# Patient Record
Sex: Male | Born: 1980 | Race: Black or African American | Hispanic: No | Marital: Married | Smoking: Never smoker
Health system: Southern US, Community
[De-identification: ages and names within clinical notes are randomized; demographics above are authoritative.]

## PROBLEM LIST (undated history)

## (undated) DIAGNOSIS — I10 Essential (primary) hypertension: Secondary | ICD-10-CM

## (undated) DIAGNOSIS — G4700 Insomnia, unspecified: Secondary | ICD-10-CM

## (undated) DIAGNOSIS — G43909 Migraine, unspecified, not intractable, without status migrainosus: Secondary | ICD-10-CM

## (undated) DIAGNOSIS — B009 Herpesviral infection, unspecified: Secondary | ICD-10-CM

## (undated) DIAGNOSIS — R202 Paresthesia of skin: Secondary | ICD-10-CM

## (undated) HISTORY — DX: Essential (primary) hypertension: I10

## (undated) HISTORY — DX: Paresthesia of skin: R20.2

## (undated) HISTORY — DX: Herpesviral infection, unspecified: B00.9

## (undated) HISTORY — DX: Migraine, unspecified, not intractable, without status migrainosus: G43.909

## (undated) HISTORY — DX: Insomnia, unspecified: G47.00

---

## 2006-03-13 ENCOUNTER — Emergency Department (HOSPITAL_COMMUNITY): Admission: EM | Admit: 2006-03-13 | Discharge: 2006-03-14 | Payer: Self-pay | Admitting: Emergency Medicine

## 2006-12-06 ENCOUNTER — Emergency Department (HOSPITAL_COMMUNITY): Admission: EM | Admit: 2006-12-06 | Discharge: 2006-12-06 | Payer: Self-pay | Admitting: Emergency Medicine

## 2007-09-05 ENCOUNTER — Emergency Department (HOSPITAL_COMMUNITY): Admission: EM | Admit: 2007-09-05 | Discharge: 2007-09-05 | Payer: Self-pay | Admitting: Emergency Medicine

## 2008-09-27 IMAGING — CT CT ANGIO CHEST
1 of 2 series · 19 of 30 positions shown · IV contrast (APPLIED)
Comparison: none

CLINICAL DATA: Chest wall pain.  Elevated D-dimer.  Possible PE.
CT ANGIOGRAPHY OF CHEST:
TECHNIQUE: Multidetector CT imaging of the chest was performed during bolus injection of intravenous contrast.  Multiplanar CT angiographic image reconstructions were generated to evaluate the vascular anatomy.
Contrast:  80 cc Omnipaque.

[Series 6: pe 1.0 b20f st · axial · 0.66mm/px · z∈[+1111,+1345]mm · 19 of 261 slices shown]
[im 14/261  lung]
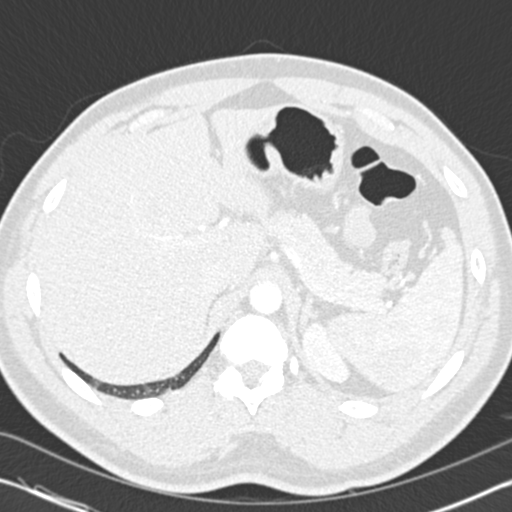
[im 27/261  mediastinal]
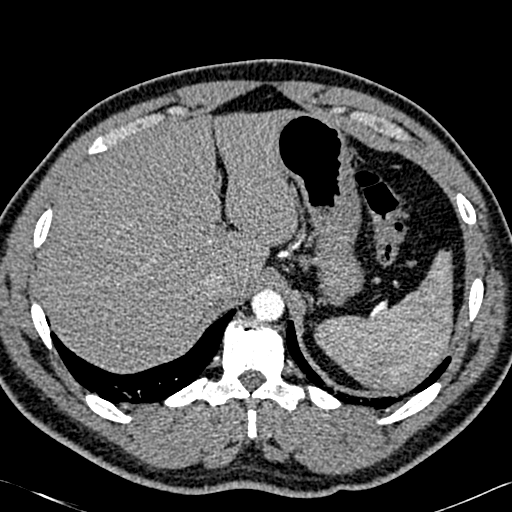
[im 40/261  lung]
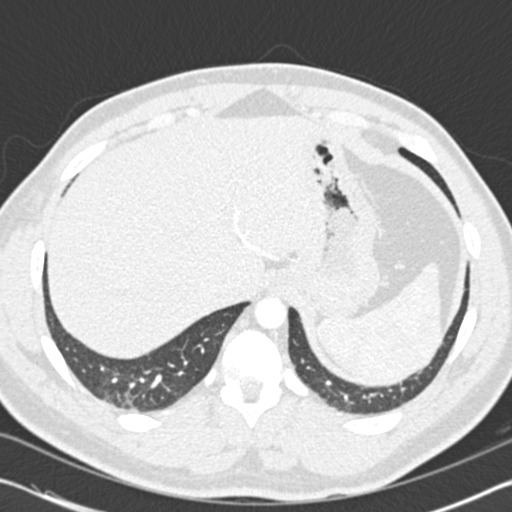
[im 53/261  mediastinal]
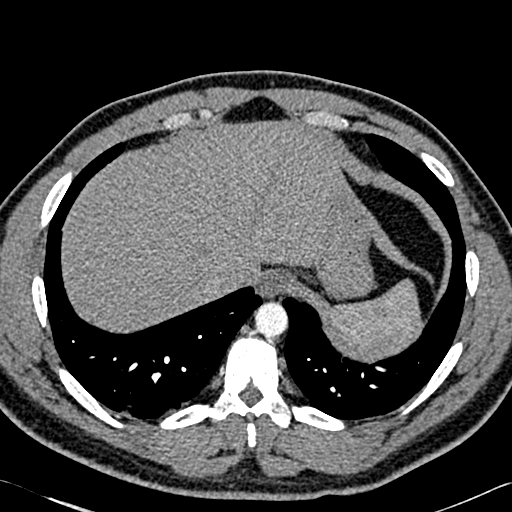
[im 66/261  lung]
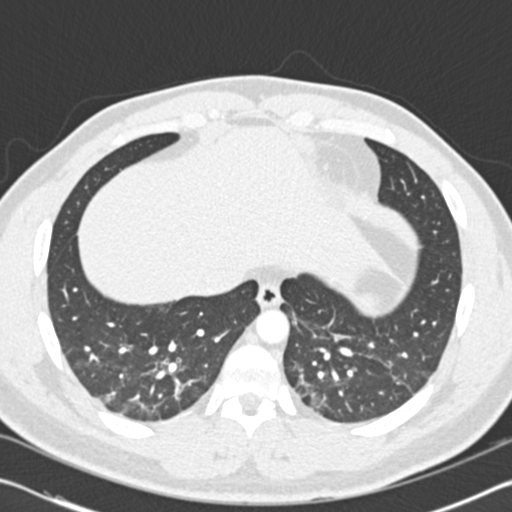
[im 79/261  mediastinal]
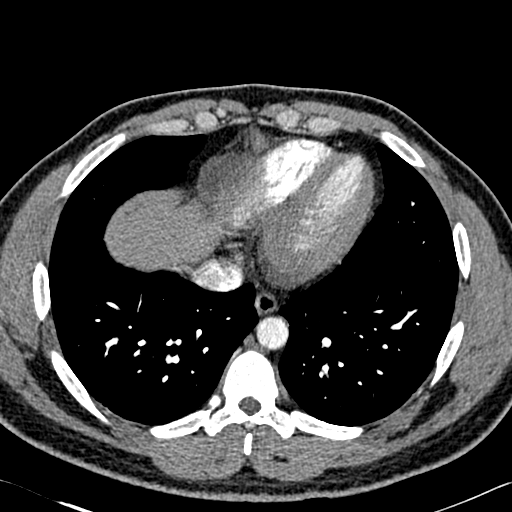
[im 92/261  lung]
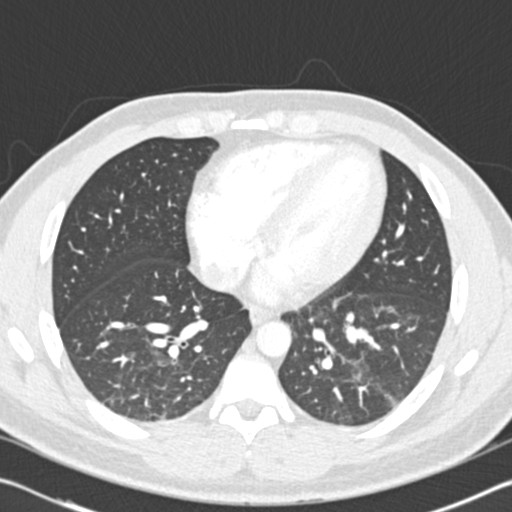
[im 105/261  mediastinal]
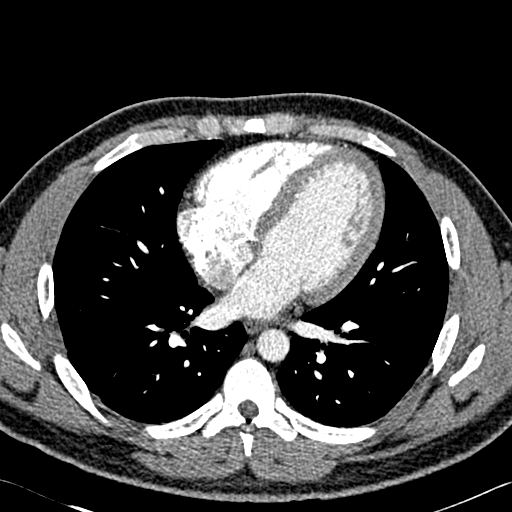
[im 118/261  lung]
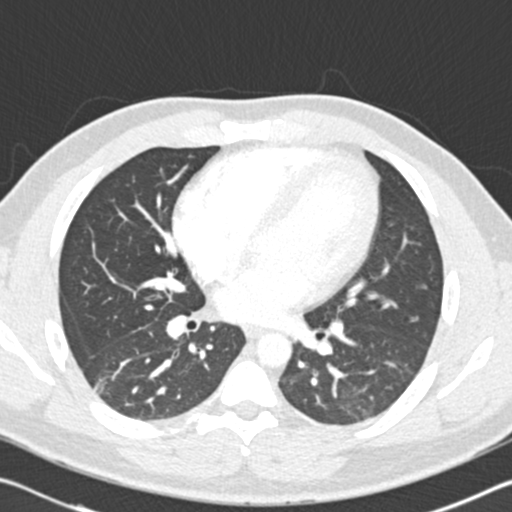
[im 131/261  mediastinal]
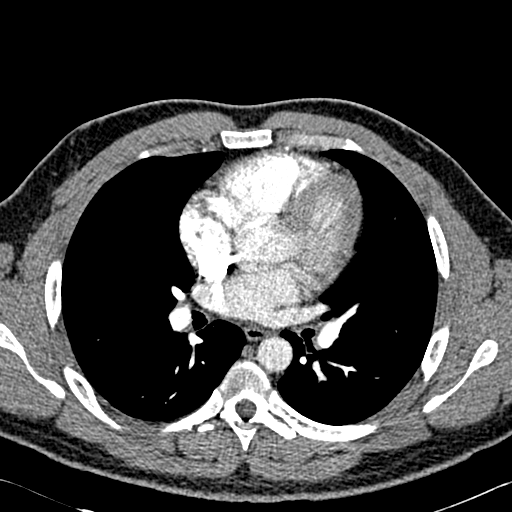
[im 144/261  lung]
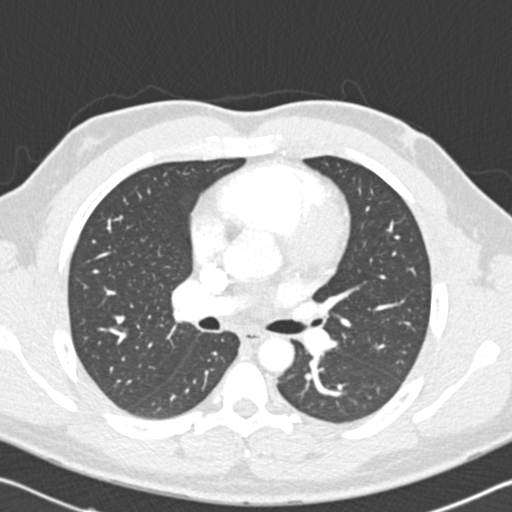
[im 157/261  mediastinal]
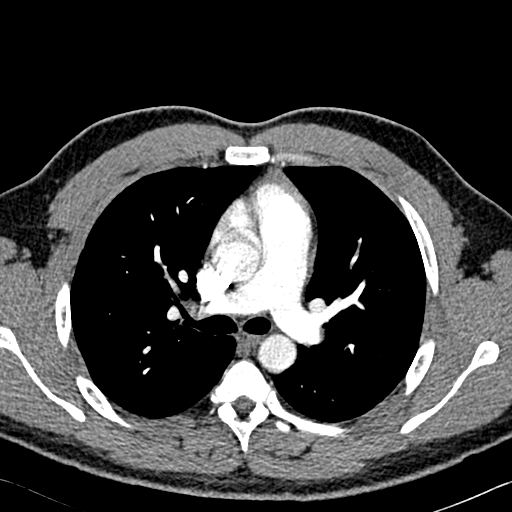
[im 170/261  lung]
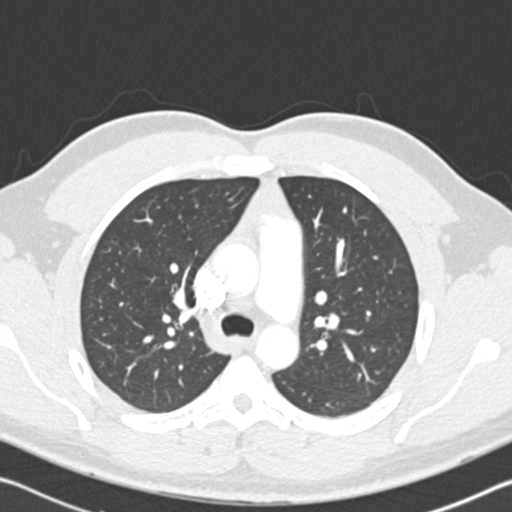
[im 183/261  mediastinal]
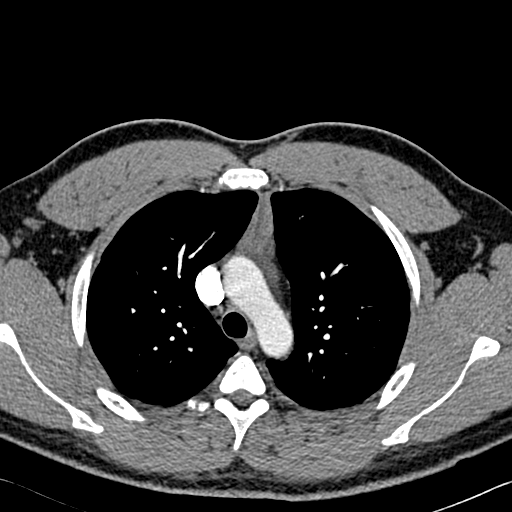
[im 196/261  lung]
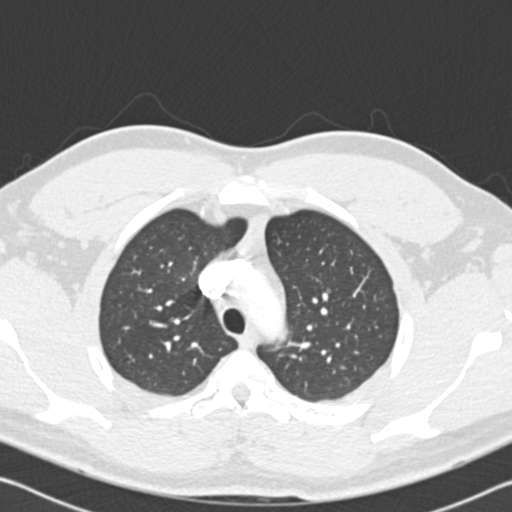
[im 209/261  mediastinal]
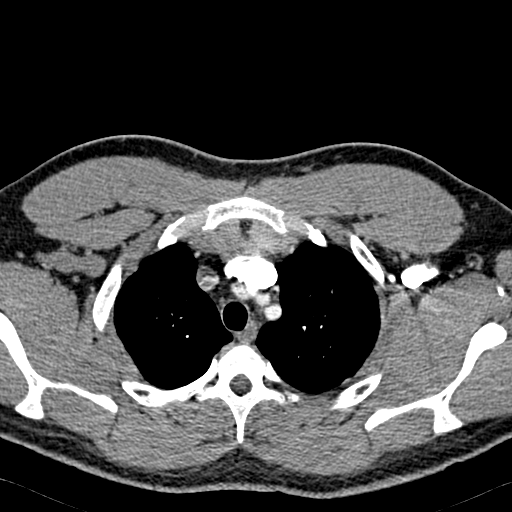
[im 222/261  lung]
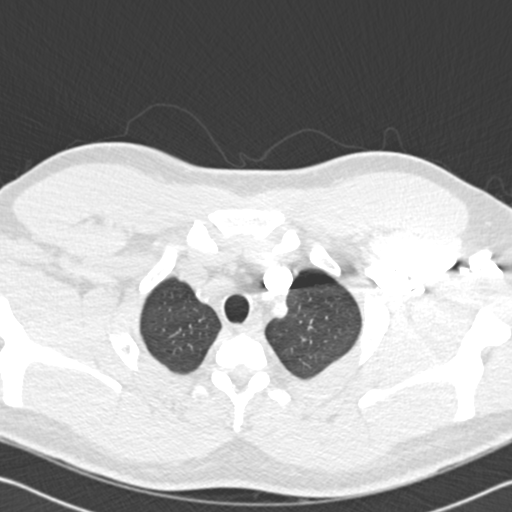
[im 235/261  mediastinal]
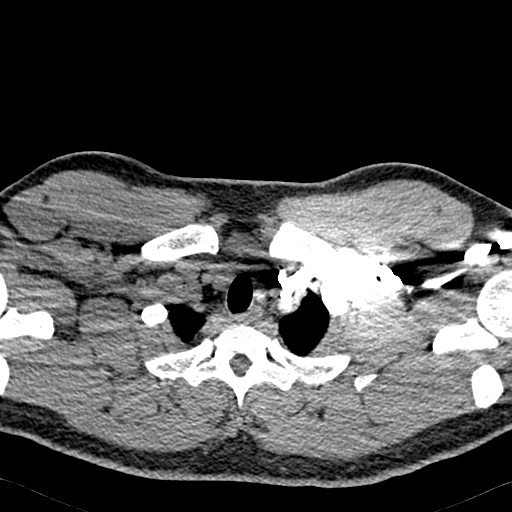
[im 248/261  lung]
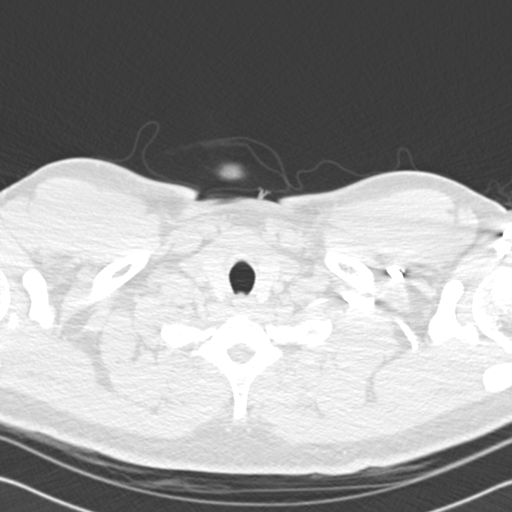

[19 of 30 positions shown; findings below may reference images not displayed]

FINDINGS: Images of the thoracic inlet are unremarkable.  No pulmonary embolus is identified.  There is no adenopathy.  Heart size is within normal limits.  The pulmonic arteries are unremarkable.  Thoracic aorta is unremarkable without evidence of a aneurysm.  There is no mediastinal hematoma.  There is no axillary adenopathy.  
No destructive bony lesions are seen. 
Images of the lung parenchyma shows no active infiltrate or pleural effusion.  No destructive bony lesions are seen.  No acute fractures are noted.
IMPRESSION: 1.  No pulmonary embolus is noted.  
2.  No infiltrate or pleural effusion.  
3.  No adenopathy.

## 2008-09-27 IMAGING — CR DG CHEST 2V
2 series · 2 of 2 positions shown · non-contrast
Comparison: 03/13/2006

CLINICAL DATA: Chest wall pain.   Left side pain.  
 CHEST - 2 VIEW:

[w chest pa]
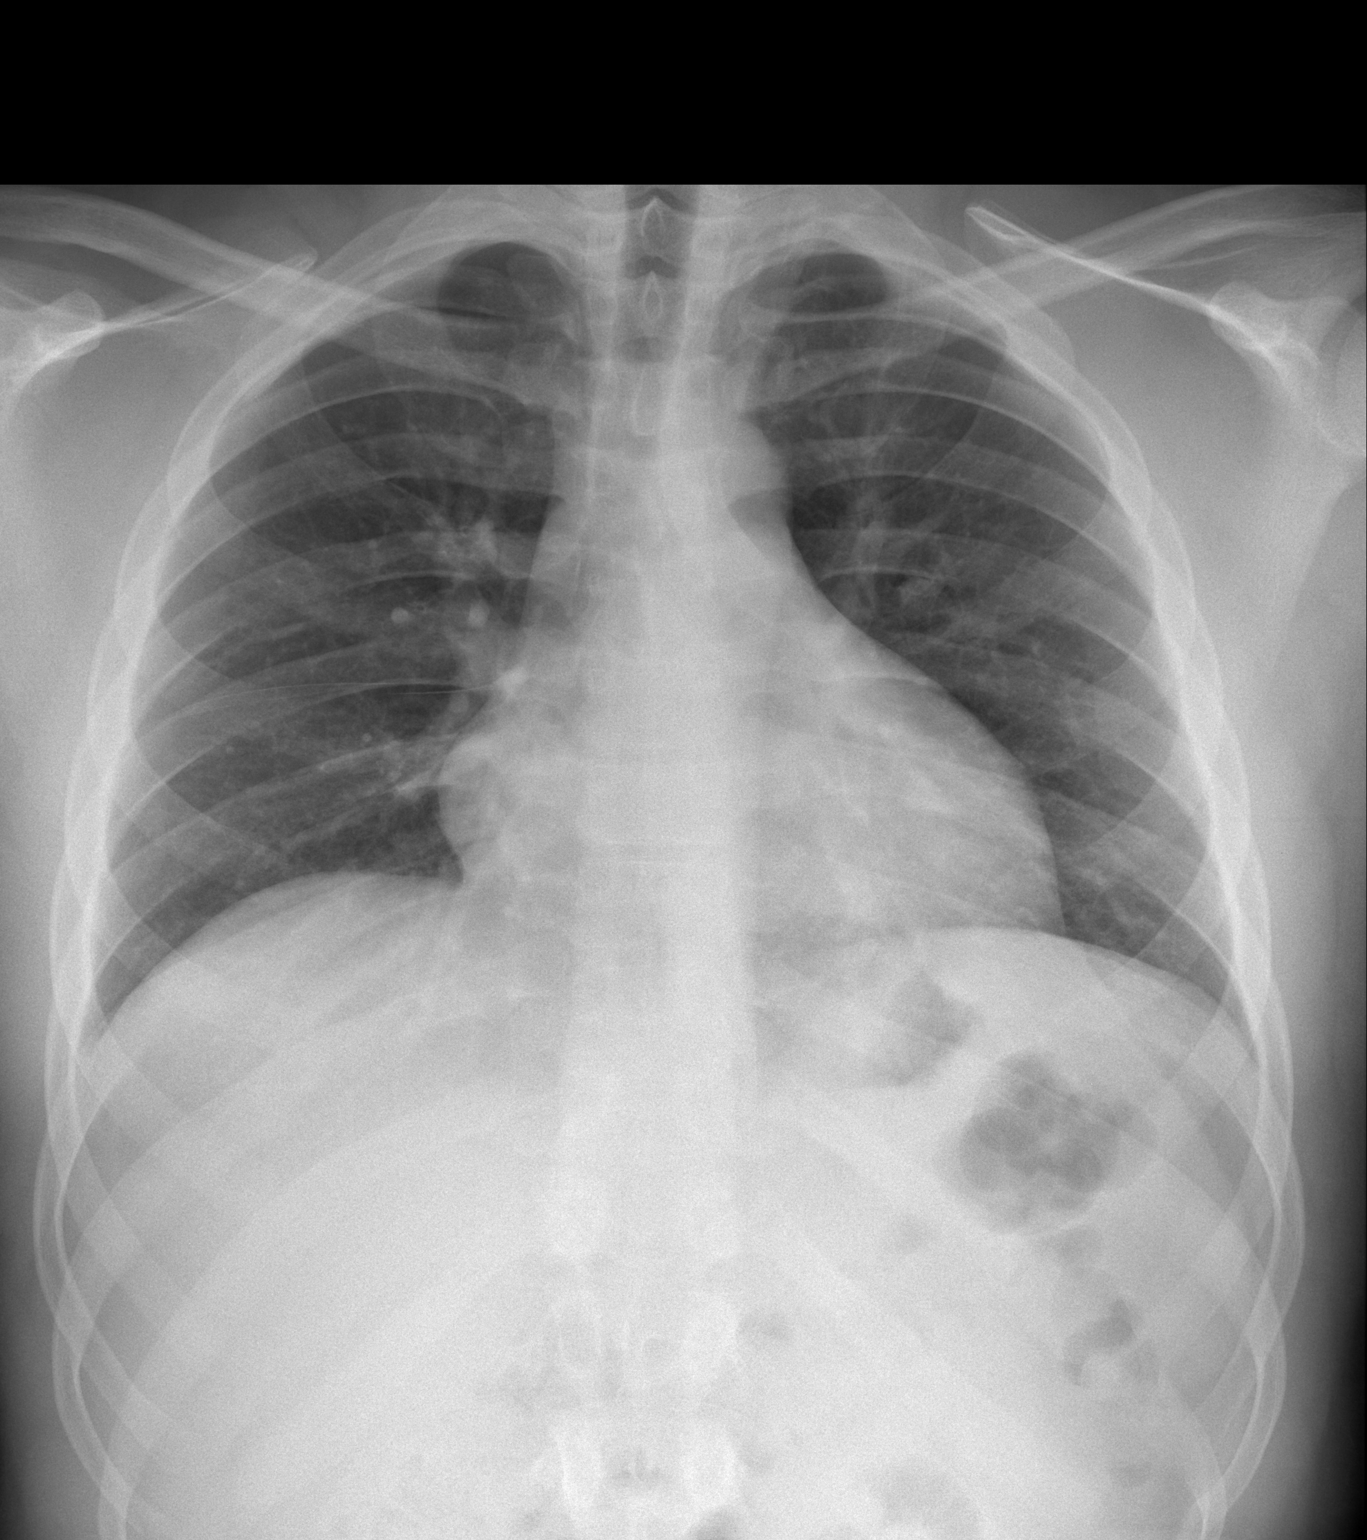

[w chest lat]
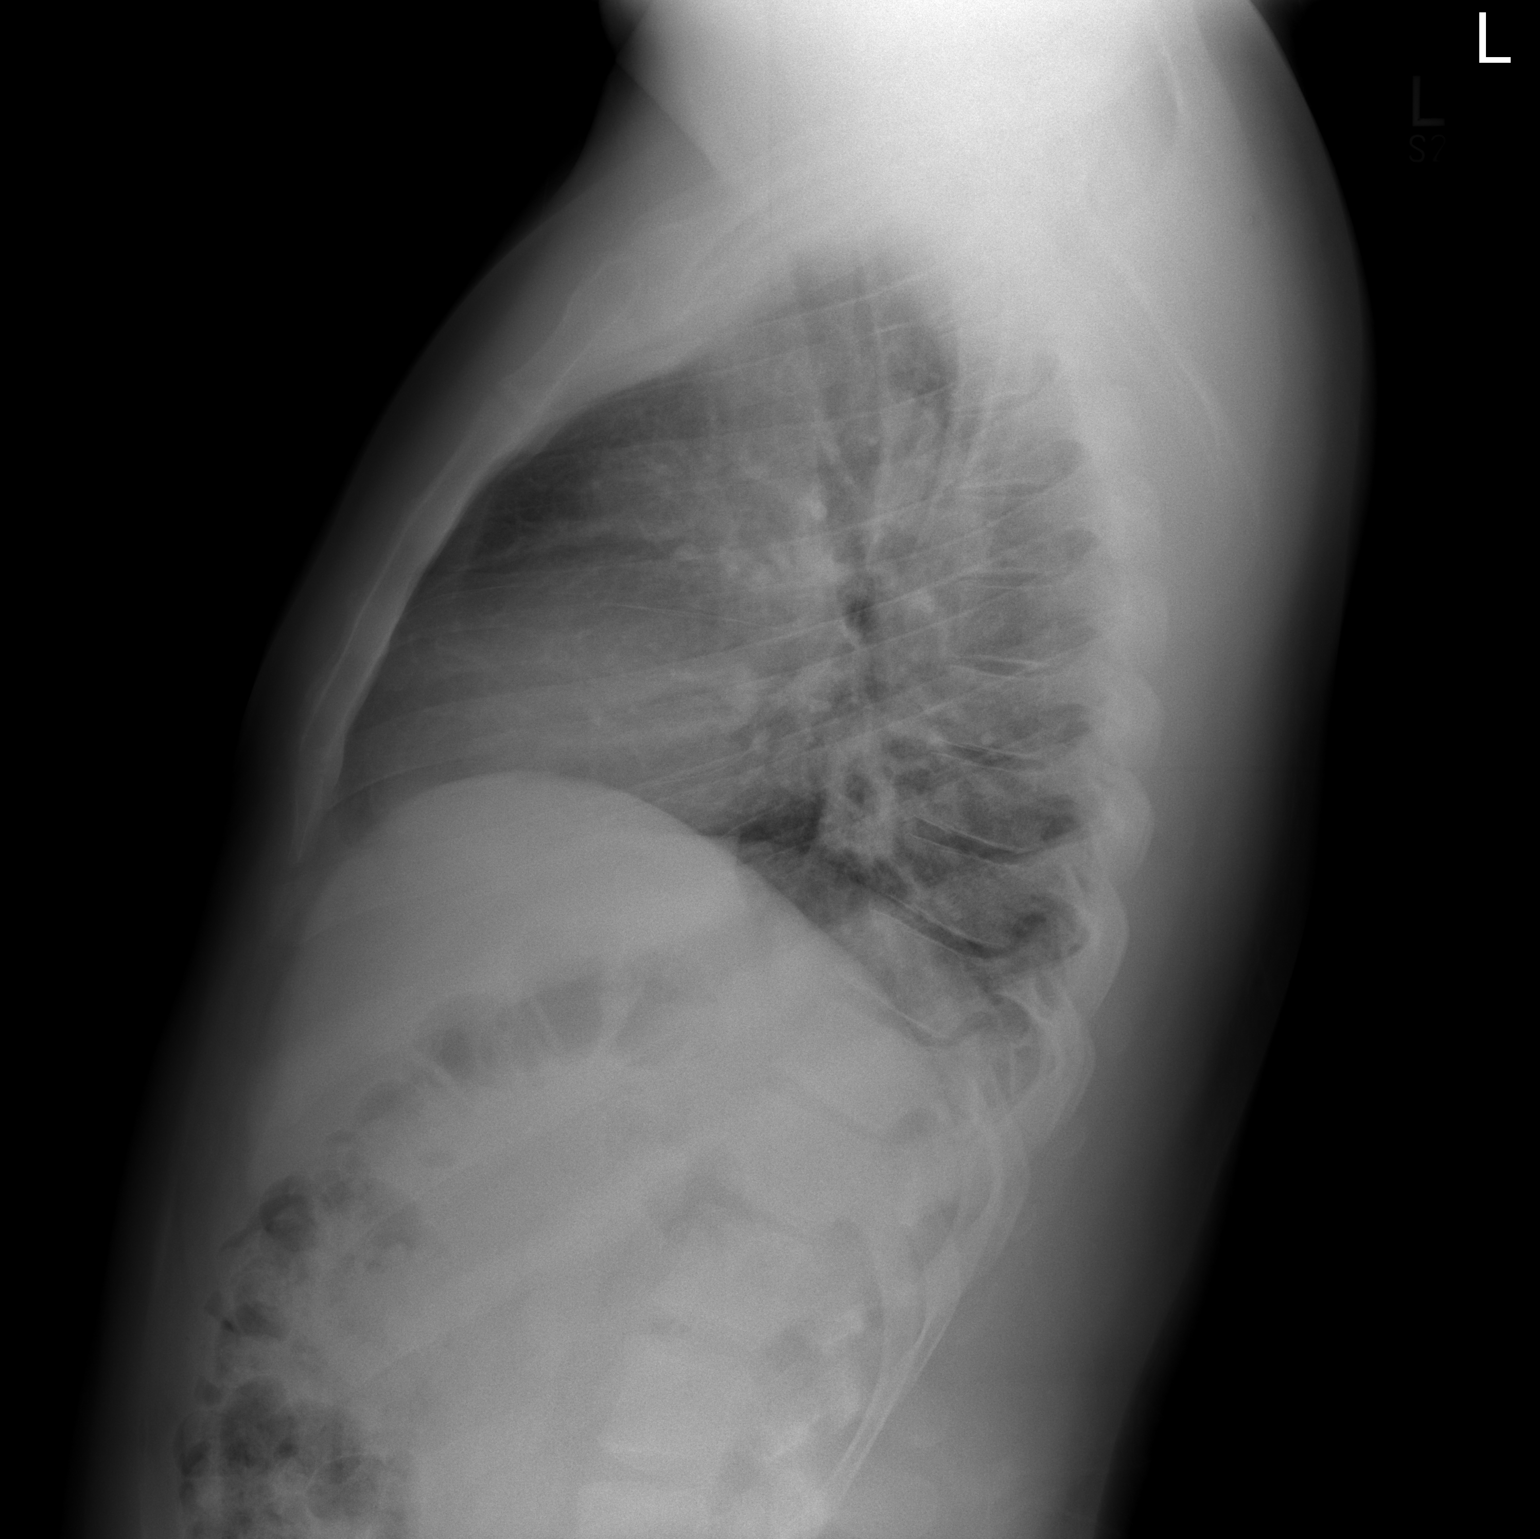

[2 of 2 positions shown; findings below may reference images not displayed]

FINDINGS: The cardiomediastinal silhouette is stable.  There is no acute infiltrate or pleural effusion. The bony structures are unremarkable.
IMPRESSION: No acute disease.

## 2011-08-10 LAB — BASIC METABOLIC PANEL
CO2: 28
Chloride: 104
GFR calc Af Amer: >60
GFR calc non Af Amer: >60

## 2011-08-10 LAB — CBC
HCT: 47
Hemoglobin: 15.6
MCHC: 33.2
MCV: 77 — ABNORMAL LOW
RDW: 13.7

## 2011-08-10 LAB — DIFFERENTIAL
Eosinophils Absolute: 0.3
Eosinophils Relative: 4
Lymphocytes Relative: 28
Lymphs Abs: 2.1
Monocytes Relative: 4

## 2011-08-10 LAB — D-DIMER, QUANTITATIVE: D-Dimer, Quant: 0.94 — ABNORMAL HIGH

## 2011-08-10 LAB — POCT CARDIAC MARKERS
CKMB, poc: <1 — ABNORMAL LOW
Operator id: 4295

## 2014-12-20 ENCOUNTER — Other Ambulatory Visit (HOSPITAL_COMMUNITY): Payer: Self-pay | Admitting: Urology

## 2014-12-20 DIAGNOSIS — N3644 Muscular disorders of urethra: Secondary | ICD-10-CM

## 2015-01-07 ENCOUNTER — Ambulatory Visit (HOSPITAL_COMMUNITY)
Admission: RE | Admit: 2015-01-07 | Discharge: 2015-01-07 | Disposition: A | Discharge status: Home / Self Care | Payer: 59 | Source: Ambulatory Visit | Attending: Urology | Admitting: Urology

## 2015-01-07 DIAGNOSIS — R350 Frequency of micturition: Secondary | ICD-10-CM | POA: Insufficient documentation

## 2015-01-07 DIAGNOSIS — N3644 Muscular disorders of urethra: Secondary | ICD-10-CM | POA: Diagnosis present

## 2015-01-07 MED ORDER — GADOBENATE DIMEGLUMINE 529 MG/ML IV SOLN
20.0000 mL | Freq: Once | INTRAVENOUS | Status: AC | PRN
  Administered 2015-01-07: 20 mL via INTRAVENOUS

## 2015-05-03 ENCOUNTER — Emergency Department (INDEPENDENT_AMBULATORY_CARE_PROVIDER_SITE_OTHER)
Admission: EM | Admit: 2015-05-03 | Discharge: 2015-05-03 | Disposition: A | Discharge status: Home / Self Care | Payer: 59 | Source: Home / Self Care | Attending: Emergency Medicine | Admitting: Emergency Medicine

## 2015-05-03 ENCOUNTER — Encounter (HOSPITAL_COMMUNITY): Payer: Self-pay | Admitting: Emergency Medicine

## 2015-05-03 DIAGNOSIS — G4452 New daily persistent headache (NDPH): Secondary | ICD-10-CM | POA: Diagnosis not present

## 2015-05-03 MED ORDER — KETOROLAC TROMETHAMINE 60 MG/2ML IM SOLN
INTRAMUSCULAR | Status: AC
  Filled 2015-05-03: qty 2

## 2015-05-03 MED ORDER — KETOROLAC TROMETHAMINE 10 MG PO TABS: 10.0000 mg | ORAL_TABLET | Freq: Four times a day (QID) | ORAL | Status: DC | PRN

## 2015-05-03 MED ORDER — KETOROLAC TROMETHAMINE 60 MG/2ML IM SOLN
60.0000 mg | Freq: Once | INTRAMUSCULAR | Status: AC
  Administered 2015-05-03: 60 mg via INTRAMUSCULAR

## 2015-05-03 NOTE — ED Provider Notes (Addendum)
CSN: [id number]     Arrival date & time 05/03/15  1323 History   First MD Initiated Contact with Patient 05/03/15 1459     Chief Complaint  Patient presents with  . Headache   (Consider location/radiation/quality/duration/timing/severity/associated sxs/prior Treatment) HPI Comments: 34 year old male is complaining of a headache for approximately one week. It is located behind the left eye. It does not migrate or radiate. Nothing seems to make it worse. Taking over-the-counter medicines such as Excedrin migraine helps modestly in temporarily. It waxes and wanes but is always there. He is able to sleep at night. It tends to be worse in the morning upon awakening. Having said that the patient states that he sleeps under cold air at 65 at night and this seems that this may be making it worse. He also had a recent change and prescription for his classes. And he is adjusting to the new prescription. He works at a computer all day long. He denies photophobia, nausea, vomiting, problems with vision, speech, hearing, swallowing, focal paresthesias or weakness or balance. Denies problems with confusion, disorientation, cognitive performance or memory. His speech is  lucid and goal oriented.   History reviewed. No pertinent past medical history. History reviewed. No pertinent past surgical history. History reviewed. No pertinent family history. History  Substance Use Topics  . Smoking status: Not on file  . Smokeless tobacco: Not on file  . Alcohol Use: Not on file    Review of Systems  Constitutional: Negative for fever, chills, appetite change and fatigue.  HENT: Negative for postnasal drip, sore throat, tinnitus and trouble swallowing.   Eyes: Negative for discharge.       Mild blurriness to the left eye. He states the headache pain is behind the left eye but not involving the left eye.  Respiratory: Negative.   Cardiovascular: Negative.   Gastrointestinal: Negative.   Genitourinary: Negative.    Musculoskeletal: Negative for myalgias, back pain, joint swelling, gait problem, neck pain and neck stiffness.  Skin: Negative.   Neurological: Positive for headaches. Negative for dizziness, tremors, seizures, syncope, facial asymmetry, speech difficulty, weakness, light-headedness and numbness.  Psychiatric/Behavioral: Negative.     Allergies  Review of patient's allergies indicates no known allergies.  Home Medications   Prior to Admission medications   Medication Sig Start Date End Date Taking? Authorizing Provider  OVER THE COUNTER MEDICATION Sinus/migraine headache medicine Excedrine   Yes Historical Provider, MD  ketorolac (TORADOL) 10 MG tablet Take 1 tablet (10 mg total) by mouth 4 (four) times daily as needed. 05/03/15   Hayden Rasmussen, NP   BP 138/100 mmHg  Pulse 70  Temp(Src) 98 F (36.7 C) (Oral)  Resp 18  SpO2 98% Physical Exam  Constitutional: He is oriented to person, place, and time. He appears well-developed and well-nourished. No distress.  HENT:  Head: Normocephalic and atraumatic.  Bilateral TMs are normal Oropharynx  clear and moist. No swelling. No erythema. soft palate rises symmetrically. Uvula and tongue are midline.   Eyes: Conjunctivae and EOM are normal. Pupils are equal, round, and reactive to light. Right eye exhibits no discharge. Left eye exhibits no discharge.  Neck: Normal range of motion. Neck supple.  Cardiovascular: Normal rate, regular rhythm and normal heart sounds.   Pulmonary/Chest: Effort normal and breath sounds normal.  Musculoskeletal: Normal range of motion. He exhibits no edema.  Lymphadenopathy:    He has no cervical adenopathy.  Neurological: He is alert and oriented to person, place, and time. He has normal  strength and normal reflexes. No cranial nerve deficit or sensory deficit. He exhibits normal muscle tone. He displays a negative Romberg sign. Coordination and gait normal. GCS eye subscore is 4. GCS verbal subscore is 5. GCS  motor subscore is 6.  Reflex Scores:      Patellar reflexes are 2+ on the right side and 2+ on the left side. Heel to toe normal. No dysmetria. No dysdiadochokinesia.  Skin: Skin is warm and dry. No rash noted. No erythema.  Psychiatric: He has a normal mood and affect.  Nursing note and vitals reviewed.   ED Course  Procedures (including critical care time) Labs Review Labs Reviewed - No data to display  Imaging Review No results found.   MDM   1. New daily persistent headache    Neurologic exam is unremarkable. No abnormalities found. I suspect his headache is multifactorial and may be related to a combination of factors such as stress, working with a computer, new prescription for refraction error, sleeping under cold air at nighttime, mild occasional nasal congestion. No evidence of intracranial lesion affect. Toradol 60 mg IM    Hayden Rasmussenavid Idalys Konecny, NP 05/03/15 1613  Hayden Rasmussenavid Annison Birchard, NP 05/06/15 2016

## 2015-05-03 NOTE — ED Notes (Signed)
Discharge was delayed secondary to post injection delay/watch for reaction.

## 2015-05-03 NOTE — ED Notes (Signed)
C/o headache.  Onset one week ago of headache.  Pain in behind left eye.  Patient does wear glasses, but has had eyes examined and eyeglass script changed after onset of head pain and no change in headache since new eye glasses.  Patient reports headache is all day, every day.  Pain does fluctuate with severity.  No runny nose, no cold.  Patient questions if headache related to cold air vent blowing on him at night.

## 2015-05-03 NOTE — Discharge Instructions (Signed)
General Headache Without Cause °A headache is pain or discomfort felt around the head or neck area. The specific cause of a headache may not be found. There are many causes and types of headaches. A few common ones are: °· Tension headaches. °· Migraine headaches. °· Cluster headaches. °· Chronic daily headaches. °HOME CARE INSTRUCTIONS  °· Keep all follow-up appointments with your caregiver or any specialist referral. °· Only take over-the-counter or prescription medicines for pain or discomfort as directed by your caregiver. °· Lie down in a dark, quiet room when you have a headache. °· Keep a headache journal to find out what may trigger your migraine headaches. For example, write down: °· What you eat and drink. °· How much sleep you get. °· Any change to your diet or medicines. °· Try massage or other relaxation techniques. °· Put ice packs or heat on the head and neck. Use these 3 to 4 times per day for 15 to 20 minutes each time, or as needed. °· Limit stress. °· Sit up straight, and do not tense your muscles. °· Quit smoking if you smoke. °· Limit alcohol use. °· Decrease the amount of caffeine you drink, or stop drinking caffeine. °· Eat and sleep on a regular schedule. °· Get 7 to 9 hours of sleep, or as recommended by your caregiver. °· Keep lights dim if bright lights bother you and make your headaches worse. °SEEK MEDICAL CARE IF:  °· You have problems with the medicines you were prescribed. °· Your medicines are not working. °· You have a change from the usual headache. °· You have nausea or vomiting. °SEEK IMMEDIATE MEDICAL CARE IF:  °· Your headache becomes severe. °· You have a fever. °· You have a stiff neck. °· You have loss of vision. °· You have muscular weakness or loss of muscle control. °· You start losing your balance or have trouble walking. °· You feel faint or pass out. °· You have severe symptoms that are different from your first symptoms. °MAKE SURE YOU:  °· Understand these  instructions. °· Will watch your condition. °· Will get help right away if you are not doing well or get worse. °Document Released: 10/18/2005 Document Revised: 01/10/2012 Document Reviewed: 11/03/2011 °ExitCare® Patient Information ©2015 [employer]. This information is not intended to replace advice given to you by your health care provider. Make sure you discuss any questions you have with your health care provider. ° °Headaches, Frequently Asked Questions °MIGRAINE HEADACHES °Q: What is migraine? What causes it? How can I treat it? °A: Generally, migraine headaches begin as a dull ache. Then they develop into a constant, throbbing, and pulsating pain. You may experience pain at the temples. You may experience pain at the front or back of one or both sides of the head. The pain is usually accompanied by a combination of: °· Nausea. °· Vomiting. °· Sensitivity to light and noise. °Some people (about 15%) experience an aura (see below) before an attack. The cause of migraine is believed to be chemical reactions in the brain. Treatment for migraine may include over-the-counter or prescription medications. It may also include self-help techniques. These include relaxation training and biofeedback.  °Q: What is an aura? °A: About 15% of people with migraine get an "aura". This is a sign of neurological symptoms that occur before a migraine headache. You may see wavy or jagged lines, dots, or flashing lights. You might experience tunnel vision or blind spots in one or both eyes. The   aura can include visual or auditory hallucinations (something imagined). It may include disruptions in smell (such as strange odors), taste or touch. Other symptoms include: °· Numbness. °· A "pins and needles" sensation. °· Difficulty in recalling or speaking the correct word. °These neurological events may last as long as 60 minutes. These symptoms will fade as the headache begins. °Q: What is a trigger? °A: Certain physical or  environmental factors can lead to or "trigger" a migraine. These include: °· Foods. °· Hormonal changes. °· Weather. °· Stress. °It is important to remember that triggers are different for everyone. To help prevent migraine attacks, you need to figure out which triggers affect you. Keep a headache diary. This is a good way to track triggers. The diary will help you talk to your healthcare professional about your condition. °Q: Does weather affect migraines? °A: Bright sunshine, hot, humid conditions, and drastic changes in barometric pressure may lead to, or "trigger," a migraine attack in some people. But studies have shown that weather does not act as a trigger for everyone with migraines. °Q: What is the link between migraine and hormones? °A: Hormones start and regulate many of your body's functions. Hormones keep your body in balance within a constantly changing environment. The levels of hormones in your body are unbalanced at times. Examples are during menstruation, pregnancy, or menopause. That can lead to a migraine attack. In fact, about three quarters of all women with migraine report that their attacks are related to the menstrual cycle.  °Q: Is there an increased risk of stroke for migraine sufferers? °A: The likelihood of a migraine attack causing a stroke is very remote. That is not to say that migraine sufferers cannot have a stroke associated with their migraines. In persons under age 40, the most common associated factor for stroke is migraine headache. But over the course of a person's normal life span, the occurrence of migraine headache may actually be associated with a reduced risk of dying from cerebrovascular disease due to stroke.  °Q: What are acute medications for migraine? °A: Acute medications are used to treat the pain of the headache after it has started. Examples over-the-counter medications, NSAIDs, ergots, and triptans.  °Q: What are the triptans? °A: Triptans are the newest class  of abortive medications. They are specifically targeted to treat migraine. Triptans are vasoconstrictors. They moderate some chemical reactions in the brain. The triptans work on receptors in your brain. Triptans help to restore the balance of a neurotransmitter called serotonin. Fluctuations in levels of serotonin are thought to be a main cause of migraine.  °Q: Are over-the-counter medications for migraine effective? °A: Over-the-counter, or "OTC," medications may be effective in relieving mild to moderate pain and associated symptoms of migraine. But you should see your caregiver before beginning any treatment regimen for migraine.  °Q: What are preventive medications for migraine? °A: Preventive medications for migraine are sometimes referred to as "prophylactic" treatments. They are used to reduce the frequency, severity, and length of migraine attacks. Examples of preventive medications include antiepileptic medications, antidepressants, beta-blockers, calcium channel blockers, and NSAIDs (nonsteroidal anti-inflammatory drugs). °Q: Why are anticonvulsants used to treat migraine? °A: During the past few years, there has been an increased interest in antiepileptic drugs for the prevention of migraine. They are sometimes referred to as "anticonvulsants". Both epilepsy and migraine may be caused by similar reactions in the brain.  °Q: Why are antidepressants used to treat migraine? °A: Antidepressants are typically used to treat people with   depression. They may reduce migraine frequency by regulating chemical levels, such as serotonin, in the brain.  °Q: What alternative therapies are used to treat migraine? °A: The term "alternative therapies" is often used to describe treatments considered outside the scope of conventional Western medicine. Examples of alternative therapy include acupuncture, acupressure, and yoga. Another common alternative treatment is herbal therapy. Some herbs are believed to relieve  headache pain. Always discuss alternative therapies with your caregiver before proceeding. Some herbal products contain arsenic and other toxins. °TENSION HEADACHES °Q: What is a tension-type headache? What causes it? How can I treat it? °A: Tension-type headaches occur randomly. They are often the result of temporary stress, anxiety, fatigue, or anger. Symptoms include soreness in your temples, a tightening band-like sensation around your head (a "vice-like" ache). Symptoms can also include a pulling feeling, pressure sensations, and contracting head and neck muscles. The headache begins in your forehead, temples, or the back of your head and neck. Treatment for tension-type headache may include over-the-counter or prescription medications. Treatment may also include self-help techniques such as relaxation training and biofeedback. °CLUSTER HEADACHES °Q: What is a cluster headache? What causes it? How can I treat it? °A: Cluster headache gets its name because the attacks come in groups. The pain arrives with little, if any, warning. It is usually on one side of the head. A tearing or bloodshot eye and a runny nose on the same side of the headache may also accompany the pain. Cluster headaches are believed to be caused by chemical reactions in the brain. They have been described as the most severe and intense of any headache type. Treatment for cluster headache includes prescription medication and oxygen. °SINUS HEADACHES °Q: What is a sinus headache? What causes it? How can I treat it? °A: When a cavity in the bones of the face and skull (a sinus) becomes inflamed, the inflammation will cause localized pain. This condition is usually the result of an allergic reaction, a tumor, or an infection. If your headache is caused by a sinus blockage, such as an infection, you will probably have a fever. An x-ray will confirm a sinus blockage. Your caregiver's treatment might include antibiotics for the infection, as well as  antihistamines or decongestants.  °REBOUND HEADACHES °Q: What is a rebound headache? What causes it? How can I treat it? °A: A pattern of taking acute headache medications too often can lead to a condition known as "rebound headache." A pattern of taking too much headache medication includes taking it more than 2 days per week or in excessive amounts. That means more than the label or a caregiver advises. With rebound headaches, your medications not only stop relieving pain, they actually begin to cause headaches. Doctors treat rebound headache by tapering the medication that is being overused. Sometimes your caregiver will gradually substitute a different type of treatment or medication. Stopping may be a challenge. Regularly overusing a medication increases the potential for serious side effects. Consult a caregiver if you regularly use headache medications more than 2 days per week or more than the label advises. °ADDITIONAL QUESTIONS AND ANSWERS °Q: What is biofeedback? °A: Biofeedback is a self-help treatment. Biofeedback uses special equipment to monitor your body's involuntary physical responses. Biofeedback monitors: °· Breathing. °· Pulse. °· Heart rate. °· Temperature. °· Muscle tension. °· Brain activity. °Biofeedback helps you refine and perfect your relaxation exercises. You learn to control the physical responses that are related to stress. Once the technique has been mastered, you   do not need the equipment any more. °Q: Are headaches hereditary? °A: Four out of five (80%) of people that suffer report a family history of migraine. Scientists are not sure if this is genetic or a family predisposition. Despite the uncertainty, a child has a 50% chance of having migraine if one parent suffers. The child has a 75% chance if both parents suffer.  °Q: Can children get headaches? °A: By the time they reach high school, most young people have experienced some type of headache. Many safe and effective  approaches or medications can prevent a headache from occurring or stop it after it has begun.  °Q: What type of doctor should I see to diagnose and treat my headache? °A: Start with your primary caregiver. Discuss his or her experience and approach to headaches. Discuss methods of classification, diagnosis, and treatment. Your caregiver may decide to recommend you to a headache specialist, depending upon your symptoms or other physical conditions. Having diabetes, allergies, etc., may require a more comprehensive and inclusive approach to your headache. The [employer] will provide, upon request, a list of NHF physician members in your state. °Document Released: 01/08/2004 Document Revised: 01/10/2012 Document Reviewed: 06/17/2008 °[employer]® Patient Information ©2015 [employer]. This information is not intended to replace advice given to you by your health care provider. Make sure you discuss any questions you have with your health care provider. ° °

## 2015-05-30 ENCOUNTER — Other Ambulatory Visit: Payer: Self-pay | Admitting: Family Medicine

## 2015-05-30 DIAGNOSIS — R51 Headache: Principal | ICD-10-CM

## 2015-05-30 DIAGNOSIS — R519 Headache, unspecified: Secondary | ICD-10-CM

## 2015-06-06 ENCOUNTER — Ambulatory Visit
Admission: RE | Admit: 2015-06-06 | Discharge: 2015-06-06 | Disposition: A | Discharge status: Home / Self Care | Payer: 59 | Source: Ambulatory Visit | Attending: Family Medicine | Admitting: Family Medicine

## 2015-06-06 DIAGNOSIS — R519 Headache, unspecified: Secondary | ICD-10-CM

## 2015-06-06 DIAGNOSIS — R51 Headache: Principal | ICD-10-CM

## 2015-06-06 MED ORDER — IOPAMIDOL (ISOVUE-300) INJECTION 61%
75.0000 mL | Freq: Once | INTRAVENOUS | Status: AC | PRN
  Administered 2015-06-06: 75 mL via INTRAVENOUS

## 2017-02-01 ENCOUNTER — Encounter (INDEPENDENT_AMBULATORY_CARE_PROVIDER_SITE_OTHER): Payer: Self-pay

## 2017-02-01 ENCOUNTER — Ambulatory Visit (INDEPENDENT_AMBULATORY_CARE_PROVIDER_SITE_OTHER): Payer: Federal, State, Local not specified - PPO | Admitting: Neurology

## 2017-02-01 ENCOUNTER — Encounter: Payer: Self-pay | Admitting: Neurology

## 2017-02-01 DIAGNOSIS — B0229 Other postherpetic nervous system involvement: Secondary | ICD-10-CM

## 2017-02-01 MED ORDER — GABAPENTIN 100 MG PO CAPS: 300.0000 mg | ORAL_CAPSULE | Freq: Every day | ORAL | 11 refills | Status: DC

## 2017-02-01 NOTE — Progress Notes (Signed)
PATIENT: Taylor Acosta DOB: January 30, 1981  Chief Complaint  Patient presents with  . 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 He completed a prescription of Valtrex.  At the time of diagnosis, he only had a rash around his mouth and it has now resolved. However, he has continued to have numbness and tingling in the area.  He also reports increased difficulty with insomnia since this outbreak.  He also uses Topamax , daily for migraines but denies history of adverse side effects with the medication.  Marland Kitchen PCP    Taylor Screws, MD     HISTORICAL  Taylor Acosta is a 36 years old right-handed male, seen in refer by his primary care physician Taylor Acosta for evaluation of paresthesia around his miles. Initial evaluation was on February 01 2017.  He has history of chronic migraine, has been taking Topamax since 2017, [facility] he suffered left lower lip herpes simplex infection at the middle of March 2018, he had  blisters broken out.  The left lower lip blister started from the left side, eventually came across the midline to the right side, it took a couple weeks for the blisters to heal, but afterwards, he began to feel needle prick numbness tingling sensation, sometimes wake him up at night, he has difficulty sleeping at nighttime  This is the second round of oral herpes infection, the first was in September 2017, is also involving his lower lips, primarily to the blisters appeared, he had numbness tingling of lower lips, but paresthesia improved after the blisters healed,  Now he feels the paresthesia also extending from bilateral lower lip, to his jawline, he has difficulty sleeping.  Laboratory evaluations on January 14 2017, normal 24 hours urine protein,    His headache has much improved with Topamax 25 mg 3 tablets every night, he has been on the stable dose of medication for a few years now.  REVIEW  OF SYSTEMS: Full 14 system review of systems performed and notable only for as above  ALLERGIES: Allergies  Allergen Reactions  . Ciprofloxacin Rash and Hives    HOME MEDICATIONS: Current Outpatient Prescriptions  Medication Sig Dispense Refill  . lisinopril (PRINIVIL,ZESTRIL) 10 MG tablet Take 10 mg by mouth daily.  2  . topiramate (TOPAMAX) 25 MG tablet Take 75 mg by mouth daily.  5   No current facility-administered medications for this visit.     PAST MEDICAL HISTORY: Past Medical History:  Diagnosis Date  . HSV-1 (herpes simplex virus 1) infection   . Hypertension   . Insomnia   . Migraine   . Paresthesia     PAST SURGICAL HISTORY: History reviewed. No pertinent surgical history.  FAMILY HISTORY: Family History  Problem Relation Age of Onset  . Hypertension Mother   . Other Father     unknown    SOCIAL HISTORY:  Social History   Social History  . Marital status: Married    Spouse name: N/A  . Number of children: 0  . Years of education: Bachelors   Occupational History  . Client Service Manager    Social History Main Topics  . Smoking status: Never Smoker  . Smokeless tobacco: Never Used  . Alcohol use No  . Drug use: No  . Sexual activity: Not on file   Other Topics Concern  . Not on file   Social History Narrative   Lives at home  with his wife.   Right-handed.   Occasional caffeine use.     PHYSICAL EXAM   Vitals:   02/01/17 1116  BP: 126/78  Pulse: 69  Weight: 266 lb (120.7 kg)  Height:  (1.702 m)    Not recorded      Body mass index is 41.66 kg/m.  PHYSICAL EXAMNIATION:  Gen: NAD, conversant, well nourised, obese, well groomed                     Cardiovascular: Regular rate rhythm, no peripheral edema, warm, nontender. Eyes: Conjunctivae clear without exudates or hemorrhage Neck: Supple, no carotid bruits. Pulmonary: Clear to auscultation bilaterally   NEUROLOGICAL EXAM:  MENTAL STATUS: Speech:    Speech is  normal; fluent and spontaneous with normal comprehension.  Cognition:     Orientation to time, place and person     Normal recent and remote memory     Normal Attention span and concentration     Normal Language, naming, repeating,spontaneous speech     Fund of knowledge   CRANIAL NERVES: CN II: Visual fields are full to confrontation. Fundoscopic exam is normal with sharp discs and no vascular changes. Pupils are round equal and briskly reactive to light. CN III, IV, VI: extraocular movement are normal. No ptosis. CN V: Facial sensation is intact to pinprick in all 3 divisions bilaterally. Corneal responses are intact.  CN VII: Face is symmetric with normal eye closure and smile. CN VIII: Hearing is normal to rubbing fingers CN IX, X: Palate elevates symmetrically. Phonation is normal. CN XI: Head turning and shoulder shrug are intact CN XII: Tongue is midline with normal movements and no atrophy.  MOTOR: There is no pronator drift of out-stretched arms. Muscle bulk and tone are normal. Muscle strength is normal.  REFLEXES: Reflexes are 2+ and symmetric at the biceps, triceps, knees, and ankles. Plantar responses are flexor.  SENSORY: Intact to light touch, pinprick, positional sensation and vibratory sensation are intact in fingers and toes.  COORDINATION: Rapid alternating movements and fine finger movements are intact. There is no dysmetria on finger-to-nose and heel-knee-shin.    GAIT/STANCE: Posture is normal. Gait is steady with normal steps, base, arm swing, and turning. Heel and toe walking are normal. Tandem gait is normal.  Romberg is absent.   DIAGNOSTIC DATA (LABS, IMAGING, TESTING) - I reviewed patient records, labs, notes, testing and imaging myself where available.   ASSESSMENT AND PLAN  Taylor Acosta is a 36 y.o. male   Post herpatic neuralgia  Start gabapentin 100 mg 3 tablets every night  Call clinic for worsening symptoms, Chronic migraine  headaches  Overall under good control with current low-dose Topamax 25 mg every night, which also has potential side effect of perioral paresthesia, but he has been on the medicine for a while, last likely his current symptoms is due to side effect from Topamax.   Taylor Acosta, M.D. Ph.D.  [facility] [address] [place], [place] [address] Ph: [phone] Fax: [phone]  [id number] Abigail Miyamoto, MD

## 2017-07-26 ENCOUNTER — Other Ambulatory Visit: Payer: Self-pay | Admitting: Family Medicine

## 2017-07-26 ENCOUNTER — Ambulatory Visit
Admission: RE | Admit: 2017-07-26 | Discharge: 2017-07-26 | Disposition: A | Discharge status: Home / Self Care | Payer: Federal, State, Local not specified - PPO | Source: Ambulatory Visit | Attending: Family Medicine | Admitting: Family Medicine

## 2017-07-26 DIAGNOSIS — M5412 Radiculopathy, cervical region: Secondary | ICD-10-CM

## 2019-09-24 ENCOUNTER — Other Ambulatory Visit: Payer: Self-pay

## 2019-09-24 DIAGNOSIS — Z20822 Contact with and (suspected) exposure to covid-19: Secondary | ICD-10-CM

## 2019-09-25 LAB — NOVEL CORONAVIRUS, NAA: SARS-CoV-2, NAA: NOT DETECTED

## 2019-10-09 ENCOUNTER — Other Ambulatory Visit: Payer: Self-pay

## 2019-10-09 DIAGNOSIS — Z20822 Contact with and (suspected) exposure to covid-19: Secondary | ICD-10-CM

## 2019-10-10 LAB — NOVEL CORONAVIRUS, NAA: SARS-CoV-2, NAA: NOT DETECTED

## 2020-01-16 ENCOUNTER — Ambulatory Visit: Payer: Self-pay | Attending: Internal Medicine

## 2020-01-16 DIAGNOSIS — Z20822 Contact with and (suspected) exposure to covid-19: Secondary | ICD-10-CM

## 2020-01-17 LAB — NOVEL CORONAVIRUS, NAA: SARS-CoV-2, NAA: NOT DETECTED

## 2020-06-17 ENCOUNTER — Ambulatory Visit
Admission: RE | Admit: 2020-06-17 | Discharge: 2020-06-17 | Disposition: A | Discharge status: Home / Self Care | Payer: Self-pay | Source: Ambulatory Visit | Attending: Family Medicine | Admitting: Family Medicine

## 2020-06-17 ENCOUNTER — Other Ambulatory Visit: Payer: Self-pay | Admitting: Family Medicine

## 2020-06-17 DIAGNOSIS — R52 Pain, unspecified: Secondary | ICD-10-CM

## 2020-11-19 ENCOUNTER — Institutional Professional Consult (permissible substitution): Payer: 59 | Admitting: Pulmonary Disease

## 2020-11-19 NOTE — Progress Notes (Deleted)
Synopsis: Referred in January 2022 for chronic cough by Henrine Screws, MD  Subjective:   PATIENT ID: Taylor Acosta GENDER: male DOB: Oct 06, 1981, MRN: [id number]  No chief complaint on file.   This is a 40 year old gentleman with a past medical history of hypertension, migraines.  Patient was referred for evaluation of chronic cough.   ***  Past Medical History:  Diagnosis Date  . HSV-1 (herpes simplex virus 1) infection   . Hypertension   . Insomnia   . Migraine   . Paresthesia      Family History  Problem Relation Age of Onset  . Hypertension Mother   . Other Father        unknown     No past surgical history on file.  Social History   Socioeconomic History  . Marital status: Married    Spouse name: Not on file  . Number of children: 0  . Years of education: Bachelors  . Highest education level: Not on file  Occupational History  . Occupation: Marine scientist  Tobacco Use  . Smoking status: Never Smoker  . Smokeless tobacco: Never Used  Substance and Sexual Activity  . Alcohol use: No  . Drug use: No  . Sexual activity: Not on file  Other Topics Concern  . Not on file  Social History Narrative   Lives at home with his wife.   Right-handed.   Occasional caffeine use.   Social Determinants of Health   Financial Resource Strain: Not on file  Food Insecurity: Not on file  Transportation Needs: Not on file  Physical Activity: Not on file  Stress: Not on file  Social Connections: Not on file  Intimate Partner Violence: Not on file     Allergies  Allergen Reactions  . Ciprofloxacin Rash and Hives     Outpatient Medications Prior to Visit  Medication Sig Dispense Refill  . albuterol (VENTOLIN HFA) 108 (90 Base) MCG/ACT inhaler Inhale 2 puffs into the lungs every 4 (four) hours as needed.    Marland Kitchen ascorbic acid (VITAMIN C) 500 MG tablet Take 500 mg by mouth daily. Take 1 tablet by mouth daily    . Multiple Vitamins-Iron (MULTIVITAMIN PLUS  IRON ADULT) TABS Take 1 tablet by mouth daily.    . valACYclovir (VALTREX) 1000 MG tablet Take 2 g by mouth daily.    Marland Kitchen atorvastatin (LIPITOR) 10 MG tablet Take 10 mg by mouth daily.    Marland Kitchen lisinopril (PRINIVIL,ZESTRIL) 10 MG tablet Take 10 mg by mouth daily.  2  . topiramate (TOPAMAX) 100 MG tablet Take 100 mg by mouth daily.    Marland Kitchen gabapentin (NEURONTIN) 100 MG capsule Take 3 capsules (300 mg total) by mouth at bedtime. 90 capsule 11  . topiramate (TOPAMAX) 25 MG tablet Take 75 mg by mouth daily.  5   No facility-administered medications prior to visit.    ROS   Objective:  Physical Exam   There were no vitals filed for this visit.   on *** LPM *** RA BMI Readings from Last 3 Encounters:  02/01/17 41.66 kg/m   Wt Readings from Last 3 Encounters:  02/01/17 266 lb (120.7 kg)     CBC    Component Value Date/Time   WBC 7.5 09/05/2007 1455   RBC 6.10 (H) 09/05/2007 1455   HGB 15.6 09/05/2007 1455   HCT 47.0 09/05/2007 1455   PLT 263 09/05/2007 1455   MCV 77.0 (L) 09/05/2007 1455   MCHC 33.2 09/05/2007 1455  RDW 13.7 09/05/2007 1455   LYMPHSABS 2.1 09/05/2007 1455   MONOABS 0.3 09/05/2007 1455   EOSABS 0.3 09/05/2007 1455   BASOSABS 0.0 09/05/2007 1455     Chest Imaging: None for review  Pulmonary Functions Testing Results: No flowsheet data found.  FeNO:   Pathology:   Echocardiogram:   Heart Catheterization:     Assessment & Plan:   No diagnosis found.  Discussion: ***   Current Outpatient Medications:  .  albuterol (VENTOLIN HFA) 108 (90 Base) MCG/ACT inhaler, Inhale 2 puffs into the lungs every 4 (four) hours as needed., Disp: , Rfl:  .  ascorbic acid (VITAMIN C) 500 MG tablet, Take 500 mg by mouth daily. Take 1 tablet by mouth daily, Disp: , Rfl:  .  Multiple Vitamins-Iron (MULTIVITAMIN PLUS IRON ADULT) TABS, Take 1 tablet by mouth daily., Disp: , Rfl:  .  valACYclovir (VALTREX) 1000 MG tablet, Take 2 g by mouth daily., Disp: , Rfl:  .   atorvastatin (LIPITOR) 10 MG tablet, Take 10 mg by mouth daily., Disp: , Rfl:  .  lisinopril (PRINIVIL,ZESTRIL) 10 MG tablet, Take 10 mg by mouth daily., Disp: , Rfl: 2 .  topiramate (TOPAMAX) 100 MG tablet, Take 100 mg by mouth daily., Disp: , Rfl:   I spent *** minutes dedicated to the care of this patient on the date of this encounter to include pre-visit review of records, face-to-face time with the patient discussing conditions above, post visit ordering of testing, clinical documentation with the electronic health record, making appropriate referrals as documented, and communicating necessary findings to members of the patients care team.   Josephine Igo, DO [facility] 11/19/2020 7:46 AM

## 2021-08-17 ENCOUNTER — Ambulatory Visit (INDEPENDENT_AMBULATORY_CARE_PROVIDER_SITE_OTHER): Payer: 59 | Admitting: Pulmonary Disease

## 2021-08-17 ENCOUNTER — Other Ambulatory Visit: Payer: Self-pay

## 2021-08-17 ENCOUNTER — Encounter: Payer: Self-pay | Admitting: Pulmonary Disease

## 2021-08-17 VITALS — BP 118/78 | HR 78 | Temp 97.8°F | Ht 68.0 in | Wt 270.0 lb

## 2021-08-17 DIAGNOSIS — R0683 Snoring: Secondary | ICD-10-CM | POA: Diagnosis not present

## 2021-08-17 NOTE — Patient Instructions (Signed)
I will see you as needed  Focus on weight loss -Exercise and diet  Home sleep study can be considered if you have significant daytime fatigue, daytime sleepiness  Sleeping with the head of the bed elevated, avoiding sleeping on your back will help sleep quality  Call with significant concerns

## 2021-08-17 NOTE — Progress Notes (Signed)
Taylor Acosta    [id number]    [birth year]  Primary Care Physician:Henrine Screws, MD  Referring Physician: Santiago Glad, MD [address] [place],  [place] [address]  Chief complaint:   Patient being seen for snoring  HPI:  Snoring, difficulty falling asleep  Usually goes to bed between 11 and 1230 minutes to an hour to fall asleep sometimes 1 awakening Final wake up time between 7 and 7:15 AM   Was having difficulty with headaches-this is improved  Denies significant daytime sleepiness  Admits to some dryness of his mouth in the mornings Occasional sore throat  He claims rare snoring No witnessed apneas No sleepy driving  Memory is good  Blood pressure is well controlled  Weight has remained stable  Has a 63-year-old and this may contribute sometimes to sleep disruption  Outpatient Encounter Medications as of 08/17/2021  Medication Sig   atorvastatin (LIPITOR) 10 MG tablet Take 1 tablet by mouth daily.   baclofen (LIORESAL) 10 MG tablet Take 10 mg by mouth 2 (two) times daily.   benzonatate (TESSALON) 200 MG capsule TAKE 1 CAPSULE BY MOUTH EVERY 8 HOURS FOR 7 DAYS AS NEEDED FOR COUGH   levocetirizine (XYZAL) 5 MG tablet Take 1 tablet by mouth daily.   lisinopril (PRINIVIL,ZESTRIL) 10 MG tablet Take 10 mg by mouth daily.   montelukast (SINGULAIR) 10 MG tablet Take 10 mg by mouth daily.   topiramate (TOPAMAX) 100 MG tablet Take 1 tablet by mouth daily.   valACYclovir (VALTREX) 1000 MG tablet 2 GM 1 tablet   [DISCONTINUED] albuterol (VENTOLIN HFA) 108 (90 Base) MCG/ACT inhaler Inhale 2 puffs into the lungs every 4 (four) hours as needed.   [DISCONTINUED] ascorbic acid (VITAMIN C) 500 MG tablet Take 500 mg by mouth daily. Take 1 tablet by mouth daily   [DISCONTINUED] atorvastatin (LIPITOR) 10 MG tablet Take 10 mg by mouth daily.   [DISCONTINUED] Multiple Vitamins-Iron (MULTIVITAMIN PLUS IRON ADULT) TABS Take 1 tablet by mouth daily.    [DISCONTINUED] topiramate (TOPAMAX) 100 MG tablet Take 100 mg by mouth daily.   [DISCONTINUED] valACYclovir (VALTREX) 1000 MG tablet Take 2 g by mouth daily.   No facility-administered encounter medications on file as of 08/17/2021.    Allergies as of 08/17/2021 - Review Complete 08/17/2021  Allergen Reaction Noted   Other Other (See Comments) 08/17/2021   Ciprofloxacin Rash and Hives 03/10/2015    Past Medical History:  Diagnosis Date   HSV-1 (herpes simplex virus 1) infection    Hypertension    Insomnia    Migraine    Paresthesia     No past surgical history on file.  Family History  Problem Relation Age of Onset   Hypertension Mother    Other Father        unknown   Diabetes Neg Hx    Stroke Neg Hx    Lupus Neg Hx    Tuberous sclerosis Neg Hx     Social History   Socioeconomic History   Marital status: Married    Spouse name: Not on file   Number of children: 0   Years of education: Bachelors   Highest education level: Not on file  Occupational History   Occupation: Marine scientist  Tobacco Use   Smoking status: Never   Smokeless tobacco: Never  Substance and Sexual Activity   Alcohol use: No   Drug use: No   Sexual activity: Not on file  Other Topics Concern  Not on file  Social History Narrative   Lives at home with his wife.   Right-handed.   Occasional caffeine use.   Social Determinants of Health   Financial Resource Strain: Not on file  Food Insecurity: Not on file  Transportation Needs: Not on file  Physical Activity: Not on file  Stress: Not on file  Social Connections: Not on file  Intimate Partner Violence: Not on file    Review of Systems  Constitutional:  Negative for fatigue.  Psychiatric/Behavioral:  Positive for sleep disturbance.    Vitals:   08/17/21 1142  BP: 118/78  Pulse: 78  Temp: 97.8 F (36.6 C)  SpO2: 98%     Physical Exam Constitutional:      Appearance: He is obese.  HENT:     Head:  Normocephalic.     Mouth/Throat:     Mouth: Mucous membranes are moist.     Comments: Mallampati 3, crowded oropharynx Eyes:     Pupils: Pupils are equal, round, and reactive to light.  Cardiovascular:     Rate and Rhythm: Normal rate and regular rhythm.     Heart sounds: No murmur heard.   No friction rub.  Pulmonary:     Effort: No respiratory distress.     Breath sounds: No stridor.  Musculoskeletal:        General: Normal range of motion.     Cervical back: No rigidity or tenderness.  Neurological:     General: No focal deficit present.     Mental Status: He is alert.  Psychiatric:        Mood and Affect: Mood normal.    Epworth Sleepiness Scale 1 Data Reviewed: Referral [facility] from [facility] reviewed  Assessment:  Snoring  Controlled hypertension  Headaches -Resolved  Obesity  Low likelihood of significant obstructive sleep apnea  Plan/Recommendations: Behavioral modification should help  Encouraged weight loss efforts  Regular exercise and diet  I do not believe you will benefit from a sleep study at present -This can be considered if any changes in symptoms  Weight loss should help significantly  Encouraged to call if any significant concerns   Virl Diamond MD [facility] 08/17/2021, 12:09 PM  CC: Santiago Glad, MD
